# Patient Record
Sex: Male | Born: 1965 | Race: Black or African American | Hispanic: No | Marital: Single
Health system: Southern US, Community
[De-identification: ages and names within clinical notes are randomized; demographics above are authoritative.]

---

## 2016-01-18 ENCOUNTER — Other Ambulatory Visit: Payer: Self-pay | Admitting: Internal Medicine

## 2016-01-18 ENCOUNTER — Ambulatory Visit
Admission: RE | Admit: 2016-01-18 | Discharge: 2016-01-18 | Disposition: A | Discharge status: Home / Self Care | Payer: BC Managed Care – PPO | Source: Ambulatory Visit | Attending: Internal Medicine | Admitting: Internal Medicine

## 2016-01-18 DIAGNOSIS — R0781 Pleurodynia: Secondary | ICD-10-CM

## 2016-01-18 DIAGNOSIS — R059 Cough, unspecified: Secondary | ICD-10-CM

## 2016-01-18 DIAGNOSIS — R05 Cough: Secondary | ICD-10-CM

## 2016-02-15 ENCOUNTER — Other Ambulatory Visit: Payer: Self-pay | Admitting: Internal Medicine

## 2016-02-15 ENCOUNTER — Ambulatory Visit
Admission: RE | Admit: 2016-02-15 | Discharge: 2016-02-15 | Disposition: A | Discharge status: Home / Self Care | Payer: BC Managed Care – PPO | Source: Ambulatory Visit | Attending: Internal Medicine | Admitting: Internal Medicine

## 2016-02-15 DIAGNOSIS — R079 Chest pain, unspecified: Secondary | ICD-10-CM

## 2016-02-15 MED ORDER — IOPAMIDOL (ISOVUE-300) INJECTION 61%
75.0000 mL | Freq: Once | INTRAVENOUS | Status: AC | PRN
  Administered 2016-02-15: 75 mL via INTRAVENOUS

## 2018-08-11 ENCOUNTER — Ambulatory Visit
Admission: RE | Admit: 2018-08-11 | Discharge: 2018-08-11 | Disposition: A | Discharge status: Home / Self Care | Payer: BC Managed Care – PPO | Source: Ambulatory Visit | Attending: Internal Medicine | Admitting: Internal Medicine

## 2018-08-11 ENCOUNTER — Other Ambulatory Visit: Payer: Self-pay | Admitting: Internal Medicine

## 2018-08-11 DIAGNOSIS — M545 Low back pain, unspecified: Secondary | ICD-10-CM

## 2019-02-16 ENCOUNTER — Other Ambulatory Visit: Payer: Self-pay

## 2019-02-16 ENCOUNTER — Ambulatory Visit
Admission: RE | Admit: 2019-02-16 | Discharge: 2019-02-16 | Disposition: A | Discharge status: Home / Self Care | Payer: BC Managed Care – PPO | Source: Ambulatory Visit | Attending: Internal Medicine | Admitting: Internal Medicine

## 2019-02-16 ENCOUNTER — Other Ambulatory Visit: Payer: Self-pay | Admitting: Internal Medicine

## 2019-02-16 DIAGNOSIS — R059 Cough, unspecified: Secondary | ICD-10-CM

## 2019-02-16 DIAGNOSIS — R05 Cough: Secondary | ICD-10-CM

## 2019-09-14 ENCOUNTER — Other Ambulatory Visit: Payer: Self-pay

## 2019-09-14 DIAGNOSIS — Z20822 Contact with and (suspected) exposure to covid-19: Secondary | ICD-10-CM

## 2019-09-15 LAB — NOVEL CORONAVIRUS, NAA: SARS-CoV-2, NAA: NOT DETECTED

## 2019-09-23 ENCOUNTER — Other Ambulatory Visit: Payer: Self-pay

## 2019-09-23 DIAGNOSIS — Z20822 Contact with and (suspected) exposure to covid-19: Secondary | ICD-10-CM

## 2019-09-24 LAB — NOVEL CORONAVIRUS, NAA: SARS-CoV-2, NAA: NOT DETECTED

## 2020-06-01 ENCOUNTER — Other Ambulatory Visit: Payer: Self-pay | Admitting: Internal Medicine

## 2020-06-01 ENCOUNTER — Ambulatory Visit
Admission: RE | Admit: 2020-06-01 | Discharge: 2020-06-01 | Disposition: A | Discharge status: Home / Self Care | Payer: BC Managed Care – PPO | Source: Ambulatory Visit | Attending: Internal Medicine | Admitting: Internal Medicine

## 2020-06-01 ENCOUNTER — Other Ambulatory Visit: Payer: Self-pay

## 2020-06-01 DIAGNOSIS — M25551 Pain in right hip: Secondary | ICD-10-CM

## 2020-06-15 ENCOUNTER — Ambulatory Visit: Payer: BC Managed Care – PPO | Admitting: Orthopaedic Surgery

## 2020-06-29 ENCOUNTER — Ambulatory Visit: Payer: Self-pay

## 2020-06-29 ENCOUNTER — Encounter: Payer: Self-pay | Admitting: Orthopaedic Surgery

## 2020-06-29 ENCOUNTER — Ambulatory Visit (INDEPENDENT_AMBULATORY_CARE_PROVIDER_SITE_OTHER): Payer: BC Managed Care – PPO | Admitting: Orthopaedic Surgery

## 2020-06-29 DIAGNOSIS — M5441 Lumbago with sciatica, right side: Secondary | ICD-10-CM

## 2020-06-29 DIAGNOSIS — G8929 Other chronic pain: Secondary | ICD-10-CM

## 2020-06-29 DIAGNOSIS — M7061 Trochanteric bursitis, right hip: Secondary | ICD-10-CM | POA: Diagnosis not present

## 2020-06-29 DIAGNOSIS — M25511 Pain in right shoulder: Secondary | ICD-10-CM | POA: Diagnosis not present

## 2020-06-29 MED ORDER — METHYLPREDNISOLONE ACETATE 40 MG/ML IJ SUSP
40.0000 mg | INTRAMUSCULAR | Status: AC | PRN
  Administered 2020-06-29: 40 mg via INTRA_ARTICULAR

## 2020-06-29 MED ORDER — LIDOCAINE HCL 1 % IJ SOLN
3.0000 mL | INTRAMUSCULAR | Status: AC | PRN
  Administered 2020-06-29: 3 mL

## 2020-06-29 MED ORDER — DICLOFENAC SODIUM 75 MG PO TBEC: 75.0000 mg | DELAYED_RELEASE_TABLET | Freq: Two times a day (BID) | ORAL | 1 refills | Status: DC | PRN

## 2020-06-29 NOTE — Progress Notes (Signed)
Office Visit Note   Patient: Craig Bell           Date of Birth: [birth year]           MRN: [id number] Visit Date: 06/29/2020              Requested by: Georgann Housekeeper, MD [address] [place],  [place] [address] PCP: Georgann Housekeeper, MD   Assessment & Plan: Visit Diagnoses:  1. Chronic right shoulder pain   2. Chronic right-sided low back pain with right-sided sciatica   3. Trochanteric bursitis, right hip     Plan: I did show him stretching exercises for his right hip down to try 2-3 times daily.  Also have recommended steroid injection of the trochanteric area and in the right shoulder and he agreed to both of these and tolerated them well.  I explained the rationale behind injections and the risk and benefits involved.  We will put him on diclofenac as an oral anti-inflammatory.  I had like to see him back in about 6 weeks to see how he is doing overall.  I do feel that he would benefit from an ergonomic desk that allows him to stand at work and I gave him a prescription for this as well. Follow-Up Instructions: Return in about 6 weeks (around 08/10/2020).   Orders:  Orders Placed This Encounter  Procedures  . Large Joint Inj  . Large Joint Inj  . XR Shoulder Right  . XR Lumbar Spine 2-3 Views   No orders of the defined types were placed in this encounter.     Procedures: Large Joint Inj: R greater trochanter on 06/29/2020 10:44 AM Indications: pain and diagnostic evaluation Details: 22 G 1.5 in needle, lateral approach  Arthrogram: No  Medications: 3 mL lidocaine 1 %; 40 mg methylPREDNISolone acetate 40 MG/ML Outcome: tolerated well, no immediate complications Procedure, treatment alternatives, risks and benefits explained, specific risks discussed. Consent was given by the patient. Immediately prior to procedure a time out was called to verify the correct patient, procedure, equipment, support staff and site/side marked as required. Patient was  prepped and draped in the usual sterile fashion.   Large Joint Inj: R subacromial bursa on 06/29/2020 10:44 AM Indications: pain and diagnostic evaluation Details: 22 G 1.5 in needle  Arthrogram: No  Medications: 3 mL lidocaine 1 %; 40 mg methylPREDNISolone acetate 40 MG/ML Outcome: tolerated well, no immediate complications Procedure, treatment alternatives, risks and benefits explained, specific risks discussed. Consent was given by the patient. Immediately prior to procedure a time out was called to verify the correct patient, procedure, equipment, support staff and site/side marked as required. Patient was prepped and draped in the usual sterile fashion.       Clinical Data: No additional findings.   Subjective: Chief Complaint  Patient presents with  . Lower Back - Pain  . 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  He denies any groin pain.  Is been getting slowly worse for about 1 to 2 months now.  He is wondering if he would benefit from a stand-up ergonomic desk at work as well.  He denies any groin pain.  He is not a diabetic and not a smoker.  He is an active individual.  He gets worse pain laying on his right side with  the shoulder and the hip at night.  He does report some decreased motion of the right shoulder.  He is never had shoulder or back or hip surgery.  HPI  Review of Systems He currently denies any headache, chest pain, shortness of breath, fever, chills, nausea, vomiting  Objective: Vital Signs: There were no vitals taken for this visit.  Physical Exam He is alert and orient x3 and in no acute distress Ortho Exam Examination of his right shoulder does show some evidence of impingement syndrome of the right shoulder with good strength overall in good range of motion except for internal rotation with adduction which  is slightly less.  Examination of her right hip shows fluid internal and external rotation.  There is pain to palpation over the tip of the greater trochanter and IT band.  There is no significant sciatic pain.  His leg lengths are equal.  He has got normal muscle and sensory exam in both lower extremities. Specialty Comments:  No specialty comments available.  Imaging: XR Lumbar Spine 2-3 Views  Result Date: 06/29/2020 Two views of the lumbar spine show no acute findings.  The alignment is well-maintained and the joint space is well-maintained.  The top of the hip ball-and-socket be seen on both sides which appears normal.  XR Shoulder Right  Result Date: 06/29/2020 Three views of the right shoulder show no acute findings.  The glenohumeral joint is well located with no significant arthritic changes.  There is narrowing of the [facility] joint and the subacromial outlet.    PMFS History: There are no problems to display for this patient.  History reviewed. No pertinent past medical history.  History reviewed. No pertinent family history.  History reviewed. No pertinent surgical history. Social History   Occupational History  . Not on file  Tobacco Use  . Smoking status: Not on file  Substance and Sexual Activity  . Alcohol use: Not on file  . Drug use: Not on file  . Sexual activity: Not on file

## 2020-08-10 ENCOUNTER — Encounter: Payer: Self-pay | Admitting: Orthopaedic Surgery

## 2020-08-10 ENCOUNTER — Ambulatory Visit (INDEPENDENT_AMBULATORY_CARE_PROVIDER_SITE_OTHER): Payer: BC Managed Care – PPO | Admitting: Orthopaedic Surgery

## 2020-08-10 DIAGNOSIS — M7061 Trochanteric bursitis, right hip: Secondary | ICD-10-CM

## 2020-08-10 DIAGNOSIS — M25511 Pain in right shoulder: Secondary | ICD-10-CM

## 2020-08-10 DIAGNOSIS — M5441 Lumbago with sciatica, right side: Secondary | ICD-10-CM | POA: Diagnosis not present

## 2020-08-10 DIAGNOSIS — G8929 Other chronic pain: Secondary | ICD-10-CM

## 2020-08-10 NOTE — Progress Notes (Signed)
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  I first saw him 6 weeks ago for right shoulder impingement, right-sided low back pain and right hip bursitis.  I did provide a steroid injection in the right shoulder subacromial space in the right hip trochanteric area.  I showed him stretching exercises to try and we had him on diclofenac.  He states that the injections were helpful.  He has an Automotive engineer at work.  He does wear a tactical vest so that does affect his posture.  He started to have some pain again in the shoulder and the hip.  He is not a diabetic.  He has had no other acute change in medical status.  On exam his right shoulder moves well but does show some mild signs of impingement.  His right hip just has a little bit of mild pain over the trochanteric area and there is right-sided low back pain with facet joint areas and with flexion extension.  He would benefit definitely from outpatient physical therapy.  Although he works in [place] he lives on the other side at [place].  I gave him a generic prescription for outpatient physical therapy to have closer to home.  I would like them to work on his right shoulder, his right side of his low back and his right hip.  Any modalities per the therapist discretion.  I will see him back in 6 weeks to see how he is doing overall and possibly place steroid injections again if needed.  All questions and concerns were answered and addressed.

## 2020-09-12 IMAGING — DX DG HIP (WITH OR WITHOUT PELVIS) 2-3V*R*
2 series · 2 of 2 positions shown · non-contrast
Comparison: None.

CLINICAL DATA: Right hip pain.

EXAM:
DG HIP (WITH OR WITHOUT PELVIS) 2-3V RIGHT

[dg hip unilat w or w/o pelvis 2-3 views  (1 of 2)]
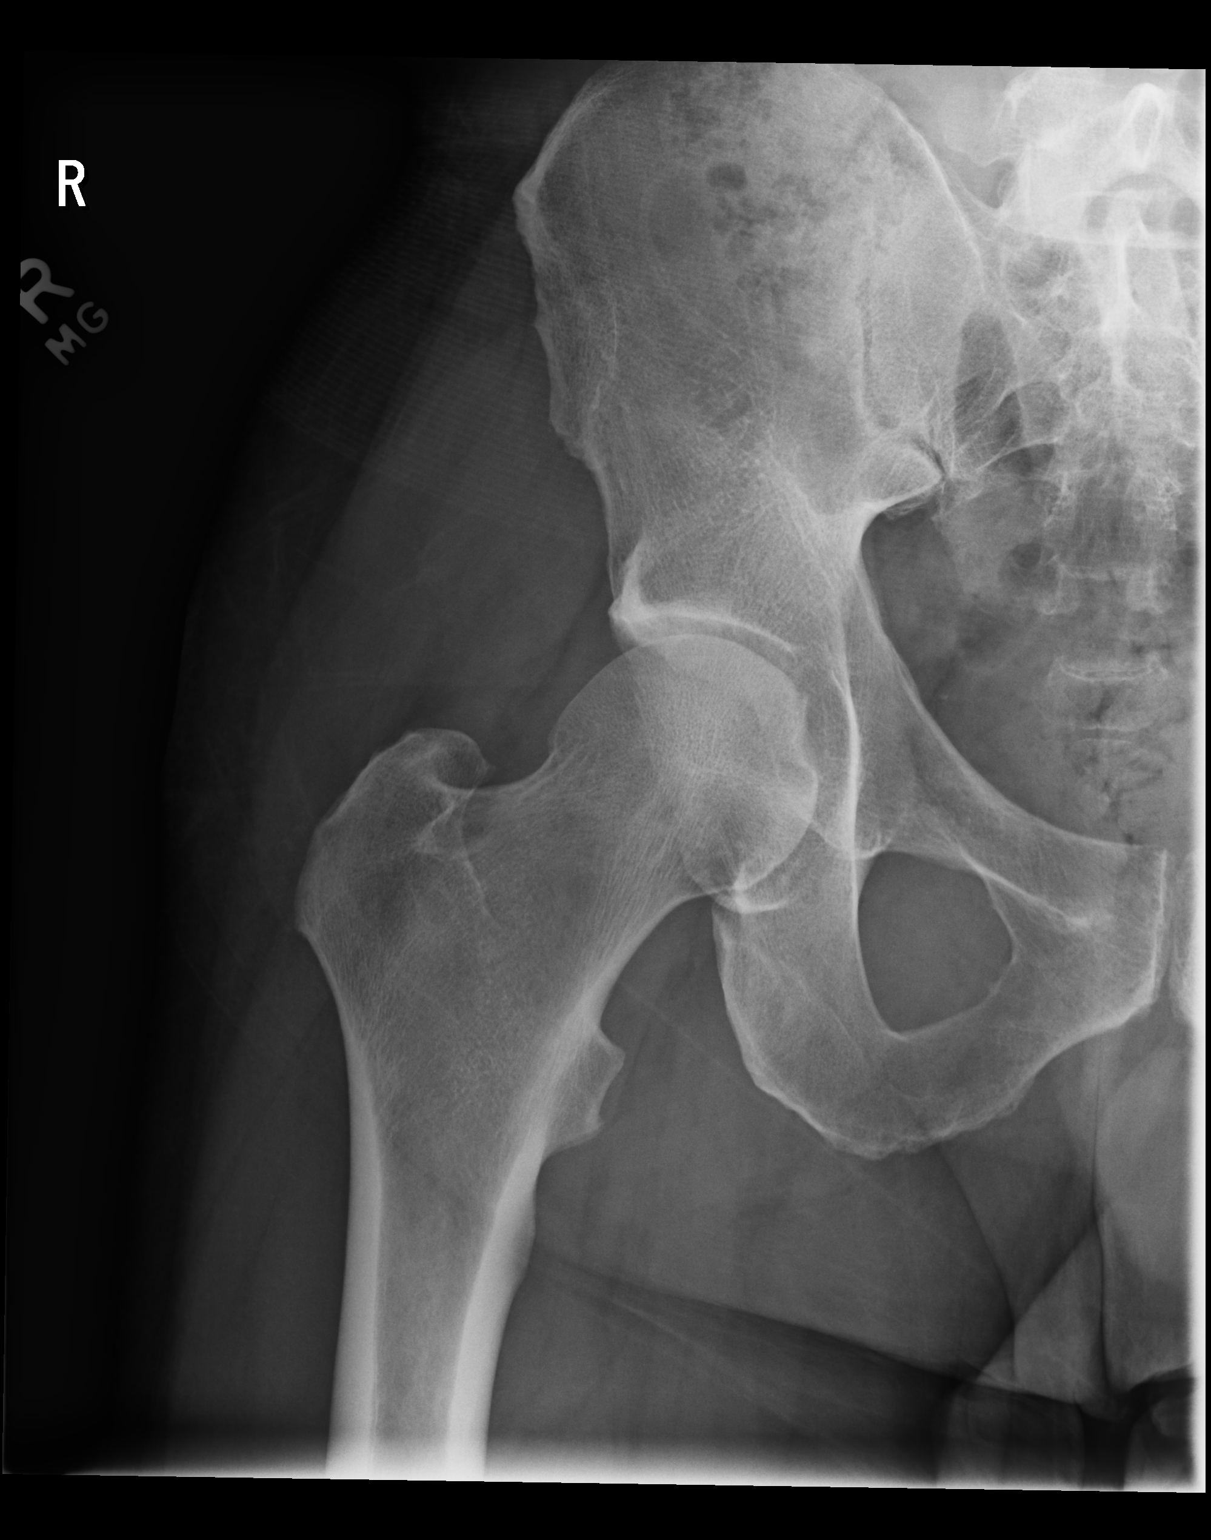

[dg hip unilat w or w/o pelvis 2-3 views  (2 of 2)]
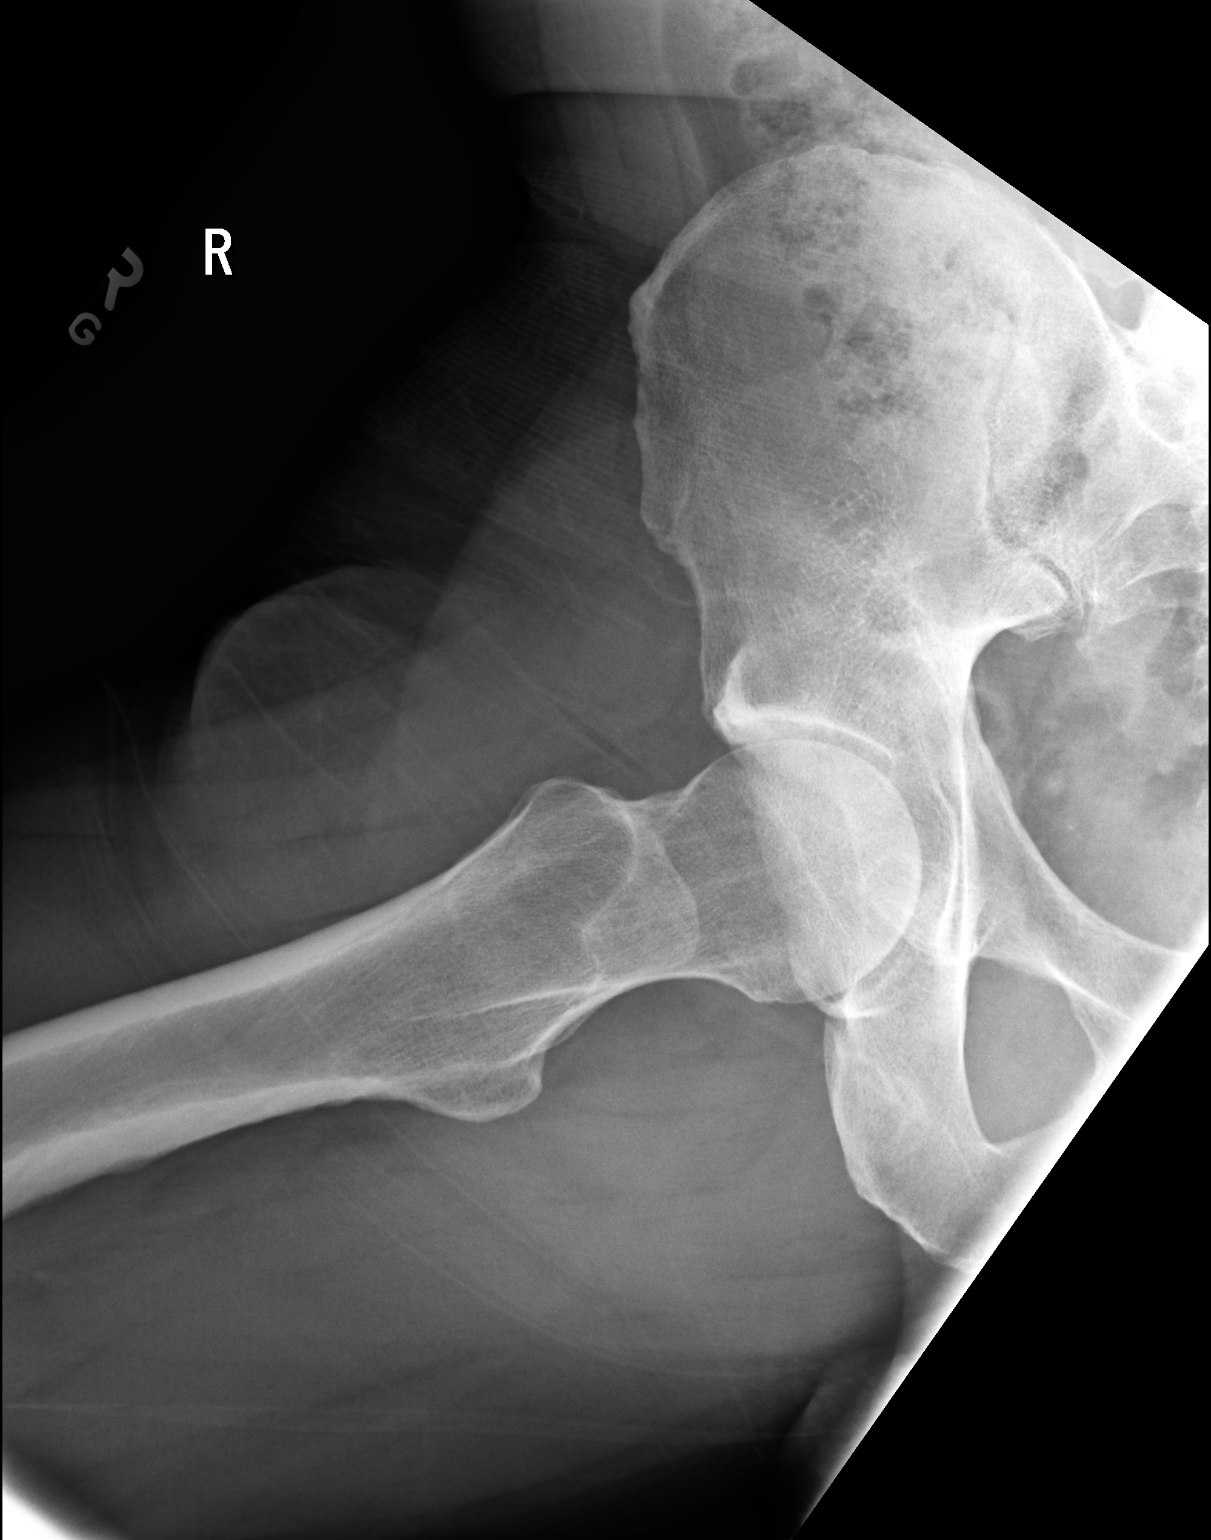

[2 of 2 positions shown; findings below may reference images not displayed]

FINDINGS: Minor hip joint space narrowing with mild acetabular spurring. No
evidence of fracture, erosion, avascular necrosis or focal bone
lesion. Pubic rami are intact. Soft tissues are unremarkable.
IMPRESSION: Mild osteoarthritis of the right hip.

## 2020-09-21 ENCOUNTER — Ambulatory Visit: Payer: BC Managed Care – PPO | Admitting: Orthopaedic Surgery

## 2022-04-27 ENCOUNTER — Other Ambulatory Visit: Payer: Self-pay | Admitting: Internal Medicine

## 2022-04-27 ENCOUNTER — Ambulatory Visit
Admission: RE | Admit: 2022-04-27 | Discharge: 2022-04-27 | Disposition: A | Discharge status: Home / Self Care | Payer: BC Managed Care – PPO | Source: Ambulatory Visit | Attending: Internal Medicine | Admitting: Internal Medicine

## 2022-04-27 DIAGNOSIS — M25512 Pain in left shoulder: Secondary | ICD-10-CM

## 2022-10-23 ENCOUNTER — Ambulatory Visit (INDEPENDENT_AMBULATORY_CARE_PROVIDER_SITE_OTHER): Payer: Managed Care, Other (non HMO)

## 2022-10-23 ENCOUNTER — Ambulatory Visit (INDEPENDENT_AMBULATORY_CARE_PROVIDER_SITE_OTHER): Payer: Managed Care, Other (non HMO) | Admitting: Orthopaedic Surgery

## 2022-10-23 DIAGNOSIS — M7542 Impingement syndrome of left shoulder: Secondary | ICD-10-CM

## 2022-10-23 DIAGNOSIS — M7061 Trochanteric bursitis, right hip: Secondary | ICD-10-CM | POA: Diagnosis not present

## 2022-10-23 DIAGNOSIS — M25551 Pain in right hip: Secondary | ICD-10-CM

## 2022-10-23 MED ORDER — HYDROCODONE-ACETAMINOPHEN 5-325 MG PO TABS: 1.0000 | ORAL_TABLET | Freq: Every evening | ORAL | 0 refills | Status: AC | PRN

## 2022-10-23 MED ORDER — CELECOXIB 200 MG PO CAPS: 200.0000 mg | ORAL_CAPSULE | Freq: Two times a day (BID) | ORAL | 1 refills | Status: DC | PRN

## 2022-10-23 MED ORDER — LIDOCAINE HCL 1 % IJ SOLN
3.0000 mL | INTRAMUSCULAR | Status: AC | PRN
  Administered 2022-10-23: 3 mL

## 2022-10-23 MED ORDER — METHYLPREDNISOLONE ACETATE 40 MG/ML IJ SUSP
40.0000 mg | INTRAMUSCULAR | Status: AC | PRN
  Administered 2022-10-23: 40 mg via INTRA_ARTICULAR

## 2022-10-23 MED ORDER — CELECOXIB 200 MG PO CAPS: 200.0000 mg | ORAL_CAPSULE | Freq: Two times a day (BID) | ORAL | 1 refills | Status: AC | PRN

## 2022-10-23 MED ORDER — HYDROCODONE-ACETAMINOPHEN 5-325 MG PO TABS: 1.0000 | ORAL_TABLET | Freq: Every evening | ORAL | 0 refills | Status: DC | PRN

## 2022-10-23 NOTE — Progress Notes (Signed)
The patient is a 57 year old gentleman last seen in the past.  This was for his right shoulder with impingement syndrome and right hip trochanteric bursitis.  This has been since 2021 that I saw him.  He comes in today with right hip pain again over the trochanteric area of his hip.  He also reports chronic left shoulder pain with decreased range of motion.  He has been in lawn for cement for many years now and now works in Therapist, music for [place]  He had x-rays of the left shoulder in July and they are on the canopy system for me to review.  He reports decree strength and range of motion of that shoulder.  The pain is all around the shoulder itself and does not radiate into his neck or down his arm.  He still points to his right hip trochanteric area as a source of his pain.  He said injection in that right hip and right shoulder helped in the past and his right shoulder is still pain-free.  He is not a diabetic.  He is scheduled to have a shingles vaccination later today and I suggested he have this in his right shoulder not the left side.  Examination of his right hip shows it moves smoothly and fluidly no pain in the groin at all.  The pain is all around the tip of the greater trochanter and the proximal IT band.  The remainder of the hip exam is normal.  His left shoulder shows significant pain with crossarm test with positive Neer and Hawkins signs.  He has pain with external rotation and slight weakness.  His liftoff is negative.  He uses his rotator cuff appropriately to abduct his shoulder.  3 views of the left shoulder in the canopy system reviewed and show some arthritic changes of the [facility] joint and the glenohumeral joint.  The humeral head is not high riding.  X-rays today in our office of his pelvis and right hip show normal-appearing hips bilaterally and no cortical irregularities around the trochanteric area of the hip joint or pelvis.  From a hip standpoint, I recommended a trochanteric  injection since this has helped him in the past and lasted for many years now.  He agreed to this and tolerated it well.  Also recommended a subacromial injection in his left shoulder.  He tolerated this as well.  I would like to see him back in 2 weeks to see how he is doing overall.  I will send in some Celebrex and occasional hydrocodone to help him sleep at night.  If he continues to have significant shoulder discomfort 2 weeks from now we will obtain an MRI of the shoulder.     Procedure Note  Patient: Craig Bell             Date of Birth: Jan 17, 1966           MRN: [id number]             Visit Date: 10/23/2022  Procedures: Visit Diagnoses:  1. Pain of right hip   2. Trochanteric bursitis, right hip   3. Impingement syndrome of left shoulder     Large Joint Inj: L subacromial bursa on 10/23/2022 9:15 AM Indications: pain and diagnostic evaluation Details: 22 G 1.5 in needle  Arthrogram: No  Medications: 3 mL lidocaine 1 %; 40 mg methylPREDNISolone acetate 40 MG/ML Outcome: tolerated well, no immediate complications Procedure, treatment alternatives, risks and benefits explained, specific risks  discussed. Consent was given by the patient. Immediately prior to procedure a time out was called to verify the correct patient, procedure, equipment, support staff and site/side marked as required. Patient was prepped and draped in the usual sterile fashion.    Large Joint Inj: R greater trochanter on 10/23/2022 9:15 AM Indications: pain and diagnostic evaluation Details: 22 G 1.5 in needle, lateral approach  Arthrogram: No  Medications: 3 mL lidocaine 1 %; 40 mg methylPREDNISolone acetate 40 MG/ML Outcome: tolerated well, no immediate complications Procedure, treatment alternatives, risks and benefits explained, specific risks discussed. Consent was given by the patient. Immediately prior to procedure a time out was called to verify the correct patient, procedure, equipment, support  staff and site/side marked as required. Patient was prepped and draped in the usual sterile fashion.

## 2022-11-14 ENCOUNTER — Encounter: Payer: Self-pay | Admitting: Orthopaedic Surgery

## 2022-11-14 ENCOUNTER — Other Ambulatory Visit: Payer: Self-pay

## 2022-11-14 ENCOUNTER — Telehealth: Payer: Self-pay | Admitting: Orthopaedic Surgery

## 2022-11-14 ENCOUNTER — Ambulatory Visit (INDEPENDENT_AMBULATORY_CARE_PROVIDER_SITE_OTHER): Payer: Managed Care, Other (non HMO) | Admitting: Orthopaedic Surgery

## 2022-11-14 ENCOUNTER — Ambulatory Visit (INDEPENDENT_AMBULATORY_CARE_PROVIDER_SITE_OTHER): Payer: Managed Care, Other (non HMO)

## 2022-11-14 DIAGNOSIS — G8929 Other chronic pain: Secondary | ICD-10-CM

## 2022-11-14 DIAGNOSIS — M79644 Pain in right finger(s): Secondary | ICD-10-CM

## 2022-11-14 DIAGNOSIS — M79645 Pain in left finger(s): Secondary | ICD-10-CM

## 2022-11-14 DIAGNOSIS — M7542 Impingement syndrome of left shoulder: Secondary | ICD-10-CM

## 2022-11-14 DIAGNOSIS — M7061 Trochanteric bursitis, right hip: Secondary | ICD-10-CM

## 2022-11-14 NOTE — Progress Notes (Signed)
The patient comes in today for follow-up still having significant left shoulder pain and impingement syndrome symptoms.  He has been through therapy on that shoulder and a home exercise program as well as multiple injections.  X-rays do show decrease in subacromial outlet of left shoulder.  On exam today his left shoulder still has painful arc of motion of the shoulder with slight weakness in the cuff with positive Neer and Hawkins signs as well.  He does complain of right thumb pain.  This is at the base of the thumb and the thenar area.  He injured this thumb in 2005.  After that he did see hand specialist and went through therapy.  He still has significant pain on a daily basis in this area of his right hand.  On examination of his right thumb there is pain all along the thenar area of the thumb but no triggering.  There is negative grind test and Finkelstein test.  X-rays of the hand and right thumb show no acute findings.  At this point a MRI of his left shoulder is warranted to assess the rotator cuff and an MRI of his right hand is warranted to assess for pathology around the base of the right thumb and thenar area for a source of chronic pain in these areas.  We will see him back once we have these MRIs.

## 2022-11-14 NOTE — Telephone Encounter (Signed)
Patient want his hip Mri with his shoulder Mri. Please advise.

## 2022-12-03 ENCOUNTER — Ambulatory Visit: Payer: BC Managed Care – PPO | Admitting: Orthopaedic Surgery

## 2022-12-08 ENCOUNTER — Ambulatory Visit
Admission: RE | Admit: 2022-12-08 | Discharge: 2022-12-08 | Disposition: A | Discharge status: Home / Self Care | Payer: BC Managed Care – PPO | Source: Ambulatory Visit | Attending: Orthopaedic Surgery | Admitting: Orthopaedic Surgery

## 2022-12-08 DIAGNOSIS — M7061 Trochanteric bursitis, right hip: Secondary | ICD-10-CM

## 2022-12-08 DIAGNOSIS — M7542 Impingement syndrome of left shoulder: Secondary | ICD-10-CM

## 2022-12-08 DIAGNOSIS — G8929 Other chronic pain: Secondary | ICD-10-CM

## 2022-12-12 ENCOUNTER — Ambulatory Visit (INDEPENDENT_AMBULATORY_CARE_PROVIDER_SITE_OTHER): Payer: Managed Care, Other (non HMO) | Admitting: Orthopaedic Surgery

## 2022-12-12 ENCOUNTER — Other Ambulatory Visit: Payer: Self-pay

## 2022-12-12 DIAGNOSIS — M7542 Impingement syndrome of left shoulder: Secondary | ICD-10-CM

## 2022-12-12 DIAGNOSIS — M79644 Pain in right finger(s): Secondary | ICD-10-CM

## 2022-12-12 DIAGNOSIS — M7061 Trochanteric bursitis, right hip: Secondary | ICD-10-CM | POA: Diagnosis not present

## 2022-12-12 DIAGNOSIS — G8929 Other chronic pain: Secondary | ICD-10-CM

## 2022-12-12 NOTE — Progress Notes (Signed)
The patient is a 57 year old gentleman who used to work in Teacher, adult education for [employer] who comes in for follow-up today after having an MRI of his left shoulder, his right hip and his right thumb.  He remotely injured that thumb years ago and was out of work for a period of time for the thumb.  He did see a hand specialist at one point.  His left shoulder is really was bothering the most with overhead activities and he would like to move his shoulder better.  Back when he was very active he reported doing about 600 push-ups every other day.  His left shoulder does show still signs of impingement.  The MRI shows a small partial-thickness rotator cuff tear but no muscle atrophy.  The remainder of the shoulder looks normal.  Both hips have pain over the trochanteric area.  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  There is some tendinosis of the gluteus medius and minimus tendons.  Both hips move smoothly and fluidly but do have pain over the lateral aspect.  The MRI of his right thumb does show arthritic changes at the MCP joint and some of the [facility] joint.  At this point I would like to send him to physical therapy for his left shoulder and his hips.  We also will refer him to the hand [place] to one of her hand fellowship trained surgeons to further evaluate and treat his right thumb based on her chronic injury and what his symptoms are in terms of the arthritic changes and pain that he is experiencing in his right thumb.  He agrees with the treatment plan.  Will see him back in 6 weeks after course of therapy for his left shoulder and both hips.

## 2022-12-26 ENCOUNTER — Ambulatory Visit: Payer: BC Managed Care – PPO | Attending: Orthopaedic Surgery

## 2022-12-26 ENCOUNTER — Other Ambulatory Visit: Payer: Self-pay

## 2022-12-26 DIAGNOSIS — G8929 Other chronic pain: Secondary | ICD-10-CM | POA: Diagnosis present

## 2022-12-26 DIAGNOSIS — R29898 Other symptoms and signs involving the musculoskeletal system: Secondary | ICD-10-CM | POA: Insufficient documentation

## 2022-12-26 DIAGNOSIS — M25612 Stiffness of left shoulder, not elsewhere classified: Secondary | ICD-10-CM | POA: Insufficient documentation

## 2022-12-26 DIAGNOSIS — M25551 Pain in right hip: Secondary | ICD-10-CM | POA: Insufficient documentation

## 2022-12-26 DIAGNOSIS — M25512 Pain in left shoulder: Secondary | ICD-10-CM | POA: Diagnosis present

## 2022-12-26 DIAGNOSIS — S46012S Strain of muscle(s) and tendon(s) of the rotator cuff of left shoulder, sequela: Secondary | ICD-10-CM | POA: Diagnosis present

## 2022-12-26 DIAGNOSIS — S46012D Strain of muscle(s) and tendon(s) of the rotator cuff of left shoulder, subsequent encounter: Secondary | ICD-10-CM | POA: Insufficient documentation

## 2022-12-26 DIAGNOSIS — M7061 Trochanteric bursitis, right hip: Secondary | ICD-10-CM | POA: Diagnosis not present

## 2022-12-26 DIAGNOSIS — M7542 Impingement syndrome of left shoulder: Secondary | ICD-10-CM | POA: Diagnosis not present

## 2022-12-26 NOTE — Therapy (Signed)
OUTPATIENT PHYSICAL THERAPY SHOULDER EVALUATION   Patient Name: Craig Bell MRN: [id number] DOB:11-Jan-1966, 57 y.o., male Today's Date: 12/26/2022  END OF SESSION:  PT End of Session - 12/26/22 0906     Visit Number 1    Date for PT Re-Evaluation 03/20/23    Progress Note Due on Visit 10    PT Start Time 0812    PT Stop Time 0850    PT Time Calculation (min) 38 min    Activity Tolerance Patient limited by pain    Behavior During Therapy [facility] for tasks assessed/performed             History reviewed. No pertinent past medical history. History reviewed. No pertinent surgical history. There are no problems to display for this patient.   PCP: Wenda Low, MD  REFERRING PROVIDER: Jean Rosenthal, MD  REFERRING DIAG: chronic pain L shoulder and R hip  THERAPY DIAG:  Strain of musc/tend the rotator cuff of l shoulder, sequela  Chronic left shoulder pain  Weakness of shoulder  Stiffness of left shoulder, not elsewhere classified  Pain in right hip  Rationale for Evaluation and Treatment: Rehabilitation  ONSET DATE: 11/2021  SUBJECTIVE:   SUBJECTIVE STATEMENT: Shoulder hurts every day, can't sleep on L side, has stopped working out for about a year.  Injection L shoulder did not make a huge difference  R hip pain over a year.  laterally on same side when carrying gun, hurts with walking, hurts with standing, hurts with sitting down too long.  Injection R hip helped.    PERTINENT HISTORY: Chronic L shoulder and R lateral hip pain.  Saw orthopedist, had MRI, received injections L shoulder and R hip, referred to PT for eval.  PAIN: 8/10 L shoulder at rest, 0/10 R hip today Are you having pain? Yes: Pain location: L shoulder Pain description: constant pain , ache Aggravating factors: lifting a certain way Relieving factors: meds somewhat  PRECAUTIONS: None  WEIGHT BEARING RESTRICTIONS: No  FALLS:  Has patient fallen in last 6 months? No  LIVING  ENVIRONMENT: unknown  OCCUPATION: Engineer, structural  PLOF: Independent  PATIENT GOALS: get rid of pain, resume working out as able  NEXT MD VISIT: mid April  OBJECTIVE:   DIAGNOSTIC FINDINGS:Copied from orthopedist's note 12/12/22: His left shoulder does show still signs of impingement.  The MRI shows a small partial-thickness rotator cuff tear but no muscle atrophy.  The remainder of the shoulder looks normal.   Both hips have pain over the trochanteric area.  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  There is some tendinosis of the gluteus medius and minimus tendons.  Both hips move smoothly and fluidly but do have pain over the lateral aspect.  PATIENT SURVEYS:  Quick DASH 50%  COGNITION: Overall cognitive status: Within functional limits for tasks assessed     SENSATION: WFL  EDEMA:  None noted   POSTURE: rounded shoulders and forward head  PALPATION: L long head biceps, L infraspinatus point tender. L supraspinatus non tender  UPPER EXTREMITY AAROM: supine L shoulder flex 120, abd 110, ER 50, IR 50, all painful throughout motion Seated AROM L shoulder, flex 90, abd 90, able to place L hand behind base of neck with forward bending of head and neck, IR reaches to L gr trochanter   MMT: L bicpes 4/5, IR painful 3+/5, Er 4+/5 not painful   GAIT: no  deficits noted.    TODAY'S TREATMENT:                                                                                                                              DATE: 12/26/22  Iontophoresis L shoulder over long head biceps tendon, advised to leave in place for 4 hrs Kinesiotaping L shoulder 2 I pieces, 35% pull distal to proximal, lateral deltoid along supraspinatus and infraspinatus tendons to assist with resting posture/tension post shoulder musculature Therx:instructed in pendulum and low intensity isometric L shoulder ER  PATIENT  EDUCATION:  Education details: POC, goals Person educated: Patient Education method: Explanation, Demonstration, and Handouts Education comprehension: verbalized understanding, tactile cues required, and needs further education  HOME EXERCISE PROGRAM: Access Code: [email or web address] URL: [email or web address] Date: 12/26/2022 Prepared by: Alayasia Breeding  Exercises - Circular Shoulder Pendulum with Table Support  - 1 x daily - 7 x weekly - 3 sets - 10 reps - Standing Isometric Shoulder External Rotation with Doorway  - 1 x daily - 7 x weekly - 3 sets - 10 reps  ASSESSMENT:  CLINICAL IMPRESSION: Patient is a 57 y.o. male who was seen today for physical therapy evaluation and treatment for L shoulder pain and L hip pain.  Abbreviated session due to started 12 min late into appt.  As his L shoulder was the most pertinent issue performed evaluation and initiated Rx with shoulder today, will need evaluation of R hip at another session.  He presents with high irritability L shoulder, rating resting pain 8/10, did not respond well to injection 2 weeks ago, point tender entire L shoulder joint.  Therefore initiated Rx with approaches to reduce the tissue irritability and maintain his ROM.  He will see the orthopedist mid April, so advised patient that we he should notice some improvement hopefully within the first few weeks, if not will reassess and refer back to MD or change Rx approach.   PARTICIPATION LIMITATIONS: community activity, occupation, and yard work  PERSONAL FACTORS: Time since onset of injury/illness/exacerbation are also affecting patient's functional outcome.   REHAB POTENTIAL: Good  CLINICAL DECISION MAKING: Stable/uncomplicated  EVALUATION COMPLEXITY: Low   GOALS: Goals reviewed with patient? Yes  SHORT TERM GOALS: Target date: 01/09/23 I HEP Baseline:initiated today Goal status: INITIAL     LONG TERM GOALS: Target date: 03/20/23  Improve AROm L shoulder to  greater than 140 flex and abd Baseline: 90 Goal status: INITIAL  2.  Improve quick DASH score to 25 L shoulder Baseline: 50 Goal status: INITIAL  3.  Improve strength L shoulder all motions to 5/5  Baseline: 3+/5 IR, 3- flex, abd, 4/5 biceps, 4-/5 ER Goal status: INITIAL     PLAN:  PT FREQUENCY: 1-2x/week  PT DURATION: 12 weeks  PLANNED INTERVENTIONS: Therapeutic exercises, Therapeutic activity, Neuromuscular re-education, Balance training, Gait training, Patient/Family education, Self Care, and Joint mobilization  PLAN FOR NEXT SESSION: ? How was ionto? How was tape?  Possible dry needling, gentle L shoulder ROM, advance isometrics, possible estim.    Kenae Lindquist L Idris Edmundson, PT 12/26/2022, 9:32 AM

## 2023-01-01 ENCOUNTER — Ambulatory Visit: Payer: BC Managed Care – PPO | Admitting: Physical Therapy

## 2023-01-04 ENCOUNTER — Ambulatory Visit: Payer: BC Managed Care – PPO | Admitting: Physical Therapy

## 2023-01-09 ENCOUNTER — Encounter: Payer: Self-pay | Admitting: Physical Therapy

## 2023-01-09 ENCOUNTER — Ambulatory Visit: Payer: BC Managed Care – PPO | Admitting: Physical Therapy

## 2023-01-09 DIAGNOSIS — S46012D Strain of muscle(s) and tendon(s) of the rotator cuff of left shoulder, subsequent encounter: Secondary | ICD-10-CM | POA: Diagnosis not present

## 2023-01-09 DIAGNOSIS — M25612 Stiffness of left shoulder, not elsewhere classified: Secondary | ICD-10-CM

## 2023-01-09 DIAGNOSIS — R29898 Other symptoms and signs involving the musculoskeletal system: Secondary | ICD-10-CM

## 2023-01-09 DIAGNOSIS — S46012S Strain of muscle(s) and tendon(s) of the rotator cuff of left shoulder, sequela: Secondary | ICD-10-CM | POA: Diagnosis not present

## 2023-01-09 DIAGNOSIS — G8929 Other chronic pain: Secondary | ICD-10-CM

## 2023-01-09 DIAGNOSIS — M25551 Pain in right hip: Secondary | ICD-10-CM

## 2023-01-09 NOTE — Therapy (Signed)
OUTPATIENT PHYSICAL THERAPY SHOULDER TREATMENT   Patient Name: Craig Bell MRN: [id number] DOB:[birth year], 57 y.o., male Today's Date: 01/09/2023  END OF SESSION:  PT End of Session - 01/09/23 0759     Visit Number 2    Date for PT Re-Evaluation 03/20/23    PT Start Time 0800    PT Stop Time 0845    PT Time Calculation (min) 45 min    Activity Tolerance Patient limited by pain;Patient tolerated treatment well    Behavior During Therapy [facility] for tasks assessed/performed             History reviewed. No pertinent past medical history. History reviewed. No pertinent surgical history. There are no problems to display for this patient.   PCP: Wenda Low, MD  REFERRING PROVIDER: Jean Rosenthal, MD  REFERRING DIAG: chronic pain L shoulder and R hip  THERAPY DIAG:  Strain of musc/tend the rotator cuff of l shoulder, sequela  Chronic left shoulder pain  Weakness of shoulder  Stiffness of left shoulder, not elsewhere classified  Pain in right hip  Rationale for Evaluation and Treatment: Rehabilitation  ONSET DATE: 11/2021  SUBJECTIVE:   SUBJECTIVE STATEMENT: Doing ok  PERTINENT HISTORY: Chronic L shoulder and R lateral hip pain.  Saw orthopedist, had MRI, received injections L shoulder and R hip, referred to PT for eval.  PAIN: 8/10 L shoulder at rest, 0/10 R hip today Are you having pain? Yes: Pain location: L shoulder Pain description: constant pain , ache Aggravating factors: lifting a certain way Relieving factors: meds somewhat  PRECAUTIONS: None  WEIGHT BEARING RESTRICTIONS: No  FALLS:  Has patient fallen in last 6 months? No  LIVING ENVIRONMENT: unknown  OCCUPATION: Engineer, structural  PLOF: Independent  PATIENT GOALS: get rid of pain, resume working out as able  NEXT MD VISIT: mid April  OBJECTIVE:   DIAGNOSTIC FINDINGS:Copied from orthopedist's note 12/12/22: His left shoulder does show still signs of impingement.  The MRI shows  a small partial-thickness rotator cuff tear but no muscle atrophy.  The remainder of the shoulder looks normal.   Both hips have pain over the trochanteric area.  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  There is some tendinosis of the gluteus medius and minimus tendons.  Both hips move smoothly and fluidly but do have pain over the lateral aspect.  PATIENT SURVEYS:  Quick DASH 50%  COGNITION: Overall cognitive status: Within functional limits for tasks assessed     SENSATION: WFL  EDEMA:  None noted   POSTURE: rounded shoulders and forward head  PALPATION: L long head biceps, L infraspinatus point tender. L supraspinatus non tender  UPPER EXTREMITY AAROM: supine L shoulder flex 120, abd 110, ER 50, IR 50, all painful throughout motion Seated AROM L shoulder, flex 90, abd 90, able to place L hand behind base of neck with forward bending of head and neck, IR reaches to L gr trochanter   MMT: L bicpes 4/5, IR painful 3+/5, Er 4+/5 not painful   GAIT: no deficits noted.    TODAY'S TREATMENT:  DATE:  01/09/23 UBE L2 x 3 min each Rows & Ext Blue 2x10 LUE IR red 2x10 Triceps Ext 35lb 2x10 L shoulder ROM   Flex 132  Abd 118 ER 82  IR 26 LUE PROM inall directions with end range holds Iontophoresis L shoulder over long head biceps tendon, advised to leave in place for 4 hrs   12/26/22  Iontophoresis L shoulder over long head biceps tendon, advised to leave in place for 4 hrs Kinesiotaping L shoulder 2 I pieces, 35% pull distal to proximal, lateral deltoid along supraspinatus and infraspinatus tendons to assist with resting posture/tension post shoulder musculature Therx:instructed in pendulum and low intensity isometric L shoulder ER  PATIENT EDUCATION:  Education  details: POC, goals Person educated: Patient Education method: Explanation, Demonstration, and Handouts Education comprehension: verbalized understanding, tactile cues required, and needs further education  HOME EXERCISE PROGRAM: Access Code: [email or web address] URL: [email or web address] Date: 12/26/2022 Prepared by: Amy Speaks  Exercises - Circular Shoulder Pendulum with Table Support  - 1 x daily - 7 x weekly - 3 sets - 10 reps - Standing Isometric Shoulder External Rotation with Doorway  - 1 x daily - 7 x weekly - 3 sets - 10 reps  ASSESSMENT:  CLINICAL IMPRESSION: Patient is a 57 y.o. male who was seen today for physical therapy treatment for L shoulder pain and L hip pain. Pt chief complaint is L shoulder pain. Session consisted of light shoulder and scapular strengthening. L shoulder ROM is limited both actively and passively .Some pain at end range of PROM. Positive response to ionto from last session.   PARTICIPATION LIMITATIONS: community activity, occupation, and yard work  PERSONAL FACTORS: Time since onset of injury/illness/exacerbation are also affecting patient's functional outcome.   REHAB POTENTIAL: Good  CLINICAL DECISION MAKING: Stable/uncomplicated  EVALUATION COMPLEXITY: Low   GOALS: Goals reviewed with patient? Yes  SHORT TERM GOALS: Target date: 01/09/23 I HEP Baseline:initiated today Goal status: Met 01/09/23     LONG TERM GOALS: Target date: 03/20/23  Improve AROm L shoulder to greater than 140 flex and abd Baseline: 90 Goal status: INITIAL  2.  Improve quick DASH score to 25 L shoulder Baseline: 50 Goal status: INITIAL  3.  Improve strength L shoulder all motions to 5/5  Baseline: 3+/5 IR, 3- flex, abd, 4/5 biceps, 4-/5 ER Goal status: INITIAL     PLAN:  PT FREQUENCY: 1-2x/week  PT DURATION: 12 weeks  PLANNED INTERVENTIONS: Therapeutic exercises, Therapeutic activity, Neuromuscular re-education, Balance training, Gait training,  Patient/Family education, Self Care, and Joint mobilization  PLAN FOR NEXT SESSION: ? How was ionto? How was tape?  Possible dry needling, gentle L shoulder ROM, advance isometrics, possible estim.    Scot Jun, PTA 01/09/2023, 8:00 AM

## 2023-01-10 ENCOUNTER — Encounter: Payer: Self-pay | Admitting: Physical Therapy

## 2023-01-10 ENCOUNTER — Ambulatory Visit: Payer: BC Managed Care – PPO | Admitting: Physical Therapy

## 2023-01-10 DIAGNOSIS — G8929 Other chronic pain: Secondary | ICD-10-CM

## 2023-01-10 DIAGNOSIS — R29898 Other symptoms and signs involving the musculoskeletal system: Secondary | ICD-10-CM

## 2023-01-10 DIAGNOSIS — S46012D Strain of muscle(s) and tendon(s) of the rotator cuff of left shoulder, subsequent encounter: Secondary | ICD-10-CM | POA: Diagnosis not present

## 2023-01-10 DIAGNOSIS — S46012S Strain of muscle(s) and tendon(s) of the rotator cuff of left shoulder, sequela: Secondary | ICD-10-CM

## 2023-01-10 DIAGNOSIS — M25551 Pain in right hip: Secondary | ICD-10-CM

## 2023-01-10 DIAGNOSIS — M25612 Stiffness of left shoulder, not elsewhere classified: Secondary | ICD-10-CM

## 2023-01-10 NOTE — Therapy (Signed)
OUTPATIENT PHYSICAL THERAPY SHOULDER TREATMENT   Patient Name: Craig Bell MRN: [id number] DOB:[birth year], 57 y.o., male Today's Date: 01/10/2023  END OF SESSION:  PT End of Session - 01/10/23 1057     Visit Number 3    Date for PT Re-Evaluation 03/20/23    PT Start Time 1057    PT Stop Time [id number]    PT Time Calculation (min) 46 min    Activity Tolerance Patient limited by pain;Patient tolerated treatment well    Behavior During Therapy [facility] for tasks assessed/performed             History reviewed. No pertinent past medical history. History reviewed. No pertinent surgical history. There are no problems to display for this patient.   PCP: Wenda Low, MD  REFERRING PROVIDER: Jean Rosenthal, MD  REFERRING DIAG: chronic pain L shoulder and R hip  THERAPY DIAG:  Strain of musc/tend the rotator cuff of l shoulder, sequela  Chronic left shoulder pain  Weakness of shoulder  Stiffness of left shoulder, not elsewhere classified  Pain in right hip  Rationale for Evaluation and Treatment: Rehabilitation  ONSET DATE: 11/2021  SUBJECTIVE:   SUBJECTIVE STATEMENT: Doing okay, a little sore from PT yesterday  PERTINENT HISTORY: Chronic L shoulder and R lateral hip pain.  Saw orthopedist, had MRI, received injections L shoulder and R hip, referred to PT for eval.  PAIN: 8/10 L shoulder at rest, 0/10 R hip today Are you having pain? Yes: Pain location: L shoulder Pain description: constant pain , ache Aggravating factors: lifting a certain way Relieving factors: meds somewhat  PRECAUTIONS: None  WEIGHT BEARING RESTRICTIONS: No  FALLS:  Has patient fallen in last 6 months? No  LIVING ENVIRONMENT: unknown  OCCUPATION: Engineer, structural  PLOF: Independent  PATIENT GOALS: get rid of pain, resume working out as able  NEXT MD VISIT: mid April  OBJECTIVE:   DIAGNOSTIC FINDINGS:Copied from orthopedist's note 12/12/22: His left shoulder does show still  signs of impingement.  The MRI shows a small partial-thickness rotator cuff tear but no muscle atrophy.  The remainder of the shoulder looks normal.   Both hips have pain over the trochanteric area.  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  There is some tendinosis of the gluteus medius and minimus tendons.  Both hips move smoothly and fluidly but do have pain over the lateral aspect.  PATIENT SURVEYS:  Quick DASH 50%  COGNITION: Overall cognitive status: Within functional limits for tasks assessed     SENSATION: WFL  EDEMA:  None noted   POSTURE: rounded shoulders and forward head  PALPATION: L long head biceps, L infraspinatus point tender. L supraspinatus non tender  UPPER EXTREMITY AAROM: supine L shoulder flex 120, abd 110, ER 50, IR 50, all painful throughout motion Seated AROM L shoulder, flex 90, abd 90, able to place L hand behind base of neck with forward bending of head and neck, IR reaches to L gr trochanter   MMT: L bicpes 4/5, IR painful 3+/5, Er 4+/5 not painful   GAIT: no deficits noted.    TODAY'S TREATMENT:  DATE:  01/10/23 Nustep level 5 x 6 minutes 25# rows 2x10 cues for form and posture 35# lats 2x10 10# straight arm pulls 5# IR on pulleys Red tband ER Red tband horizontal abduction 35# triceps 2x10 Passive stretch all shoulder motions Ionto patch 15mA dose left biceps origin.  Again advised 4 hours only as he thought he could leave on for 4 days  01/09/23 UBE L2 x 3 min each Rows & Ext Blue 2x10 LUE IR red 2x10 Triceps Ext 35lb 2x10 L shoulder ROM   Flex 132  Abd 118 ER 82  IR 26 LUE PROM inall directions with end range holds Iontophoresis L shoulder over long head biceps tendon, advised to leave in place for 4 hrs   12/26/22   Iontophoresis L shoulder over long head biceps tendon, advised to leave in place for 4 hrs Kinesiotaping L shoulder 2 I pieces, 35% pull distal to proximal, lateral deltoid along supraspinatus and infraspinatus tendons to assist with resting posture/tension post shoulder musculature Therx:instructed in pendulum and low intensity isometric L shoulder ER  PATIENT EDUCATION:  Education details: POC, goals Person educated: Patient Education method: Explanation, Demonstration, and Handouts Education comprehension: verbalized understanding, tactile cues required, and needs further education  HOME EXERCISE PROGRAM: Access Code: [email or web address] URL: [email or web address] Date: 12/26/2022 Prepared by: Amy Speaks  Exercises - Circular Shoulder Pendulum with Table Support  - 1 x daily - 7 x weekly - 3 sets - 10 reps - Standing Isometric Shoulder External Rotation with Doorway  - 1 x daily - 7 x weekly - 3 sets - 10 reps  ASSESSMENT:  CLINICAL IMPRESSION: Patient is a 57 y.o. male who was seen today for physical therapy treatment for L shoulder pain and L hip pain. Pt chief complaint is L shoulder pain. Continued to add shoulder strength and focus was on scapular stability and posture, he tends to be rounded shoulders, needing cues for posture and some cues for form on the machines.  Had pain with the lat pulls, all PROM was good except for abduction and flexion were painful toward end ranges  PARTICIPATION LIMITATIONS: community activity, occupation, and yard work  PERSONAL FACTORS: Time since onset of injury/illness/exacerbation are also affecting patient's functional outcome.   REHAB POTENTIAL: Good  CLINICAL DECISION MAKING: Stable/uncomplicated  EVALUATION COMPLEXITY: Low   GOALS: Goals reviewed with patient? Yes  SHORT TERM GOALS: Target date: 01/09/23 I HEP Baseline:initiated today Goal status: Met 01/09/23     LONG TERM GOALS: Target date: 03/20/23  Improve AROm L  shoulder to greater than 140 flex and abd Baseline: 90 Goal status: INITIAL  2.  Improve quick DASH score to 25 L shoulder Baseline: 50 Goal status: INITIAL  3.  Improve strength L shoulder all motions to 5/5  Baseline: 3+/5 IR, 3- flex, abd, 4/5 biceps, 4-/5 ER Goal status: INITIAL     PLAN:  PT FREQUENCY: 1-2x/week  PT DURATION: 12 weeks  PLANNED INTERVENTIONS: Therapeutic exercises, Therapeutic activity, Neuromuscular re-education, Balance training, Gait training, Patient/Family education, Self Care, and Joint mobilization  PLAN FOR NEXT SESSION: continue to advance scapular stability being careful with the RC tear and pain, work on posture   [employer], PT 01/10/2023, 10:58 AM

## 2023-01-14 ENCOUNTER — Ambulatory Visit: Payer: Managed Care, Other (non HMO) | Attending: Orthopaedic Surgery | Admitting: Physical Therapy

## 2023-01-14 DIAGNOSIS — R29898 Other symptoms and signs involving the musculoskeletal system: Secondary | ICD-10-CM | POA: Insufficient documentation

## 2023-01-14 DIAGNOSIS — S46012S Strain of muscle(s) and tendon(s) of the rotator cuff of left shoulder, sequela: Secondary | ICD-10-CM | POA: Insufficient documentation

## 2023-01-14 DIAGNOSIS — S46012D Strain of muscle(s) and tendon(s) of the rotator cuff of left shoulder, subsequent encounter: Secondary | ICD-10-CM | POA: Insufficient documentation

## 2023-01-14 DIAGNOSIS — G8929 Other chronic pain: Secondary | ICD-10-CM | POA: Insufficient documentation

## 2023-01-14 DIAGNOSIS — M25612 Stiffness of left shoulder, not elsewhere classified: Secondary | ICD-10-CM | POA: Insufficient documentation

## 2023-01-14 DIAGNOSIS — M25512 Pain in left shoulder: Secondary | ICD-10-CM | POA: Insufficient documentation

## 2023-01-18 ENCOUNTER — Ambulatory Visit: Payer: Managed Care, Other (non HMO) | Admitting: Physical Therapy

## 2023-01-18 DIAGNOSIS — S46012S Strain of muscle(s) and tendon(s) of the rotator cuff of left shoulder, sequela: Secondary | ICD-10-CM | POA: Diagnosis present

## 2023-01-18 DIAGNOSIS — M25612 Stiffness of left shoulder, not elsewhere classified: Secondary | ICD-10-CM

## 2023-01-18 DIAGNOSIS — M25512 Pain in left shoulder: Secondary | ICD-10-CM | POA: Diagnosis present

## 2023-01-18 DIAGNOSIS — G8929 Other chronic pain: Secondary | ICD-10-CM | POA: Diagnosis present

## 2023-01-18 DIAGNOSIS — S46012D Strain of muscle(s) and tendon(s) of the rotator cuff of left shoulder, subsequent encounter: Secondary | ICD-10-CM | POA: Diagnosis not present

## 2023-01-18 DIAGNOSIS — R29898 Other symptoms and signs involving the musculoskeletal system: Secondary | ICD-10-CM | POA: Diagnosis not present

## 2023-01-18 NOTE — Therapy (Signed)
OUTPATIENT PHYSICAL THERAPY SHOULDER TREATMENT   Patient Name: Craig Bell MRN: [id number] DOB:1966-07-06, 57 y.o., male Today's Date: 01/18/2023  END OF SESSION:  PT End of Session - 01/18/23 0759     Visit Number 4    Date for PT Re-Evaluation 03/20/23    PT Start Time 0800    PT Stop Time 0845    PT Time Calculation (min) 45 min             No past medical history on file. No past surgical history on file. There are no problems to display for this patient.   PCP: Georgann HousekeeperKarrar Husain, MD  REFERRING PROVIDER: Doneen Poissonhristopher Blackman, MD  REFERRING DIAG: chronic pain L shoulder and R hip  THERAPY DIAG:  Strain of musc/tend the rotator cuff of l shoulder, sequela  Chronic left shoulder pain  Weakness of shoulder  Stiffness of left shoulder, not elsewhere classified  Rationale for Evaluation and Treatment: Rehabilitation  ONSET DATE: 11/2021  SUBJECTIVE:   SUBJECTIVE STATEMENT: No issues with hip since inj. Shld better- patch helps  PERTINENT HISTORY: Chronic L shoulder and R lateral hip pain.  Saw orthopedist, had MRI, received injections L shoulder and R hip, referred to PT for eval.  PAIN: 5/10 L shoulder at rest, 0/10 R hip today Are you having pain? Yes: Pain location: L shoulder Pain description: constant pain , ache Aggravating factors: lifting a certain way Relieving factors: meds somewhat  PRECAUTIONS: None  WEIGHT BEARING RESTRICTIONS: No  FALLS:  Has patient fallen in last 6 months? No  LIVING ENVIRONMENT: unknown  OCCUPATION: Emergency planning/management officerpolice officer  PLOF: Independent  PATIENT GOALS: get rid of pain, resume working out as able  NEXT MD VISIT: mid April  OBJECTIVE:   DIAGNOSTIC FINDINGS:Copied from orthopedist's note 12/12/22: His left shoulder does show still signs of impingement.  The MRI shows a small partial-thickness rotator cuff tear but no muscle atrophy.  The remainder of the shoulder looks normal.   Both hips have pain over the  trochanteric area.  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  There is some tendinosis of the gluteus medius and minimus tendons.  Both hips move smoothly and fluidly but do have pain over the lateral aspect.  PATIENT SURVEYS:  Quick DASH 50%  COGNITION: Overall cognitive status: Within functional limits for tasks assessed     SENSATION: WFL  EDEMA:  None noted   POSTURE: rounded shoulders and forward head  PALPATION: L long head biceps, L infraspinatus point tender. L supraspinatus non tender  UPPER EXTREMITY AAROM: supine L shoulder flex 120, abd 110, ER 50, IR 50, all painful throughout motion Seated AROM L shoulder, flex 90, abd 90, able to place L hand behind base of neck with forward bending of head and neck, IR reaches to L gr trochanter   MMT: L bicpes 4/5, IR painful 3+/5, Er 4+/5 not painful   GAIT: no deficits noted.    TODAY'S TREATMENT:  DATE:   01/18/23 UBE L 3 3 min fwd/3  min backward Lat Pull Down 35# 2 sets 12 Seated Row 35# 2 sets 10 L shoulder AROM / MMT standing  Flex 140            4+ pain  Abd 132             4+ pain ER  70               4+    IR  55                 4 pain Green tband shld ext,row,horz abd, and ER 2 sets 10 -issued for [employer], upper cut and empty can 4# 2 sets 10 ( pain with empty can) Passive stretch all shoulder motions Ionto patch 93mA dose left biceps origin.   advised 4 hours only      01/10/23 Nustep level 5 x 6 minutes 25# rows 2x10 cues for form and posture 35# lats 2x10 10# straight arm pulls 5# IR on pulleys Red tband ER Red tband horizontal abduction 35# triceps 2x10 Passive stretch all shoulder motions Ionto patch 37mA dose left biceps origin.  Again advised 4 hours only as he thought he  could leave on for 4 days  01/09/23 UBE L2 x 3 min each Rows & Ext Blue 2x10 LUE IR red 2x10 Triceps Ext 35lb 2x10 L shoulder ROM   Flex 132  Abd 118 ER 82  IR 26 LUE PROM inall directions with end range holds Iontophoresis L shoulder over long head biceps tendon, advised to leave in place for 4 hrs   12/26/22  Iontophoresis L shoulder over long head biceps tendon, advised to leave in place for 4 hrs Kinesiotaping L shoulder 2 I pieces, 35% pull distal to proximal, lateral deltoid along supraspinatus and infraspinatus tendons to assist with resting posture/tension post shoulder musculature Therx:instructed in pendulum and low intensity isometric L shoulder ER  PATIENT EDUCATION:  Education details: POC, goals Person educated: Patient Education method: Explanation, Demonstration, and Handouts Education comprehension: verbalized understanding, tactile cues required, and needs further education  HOME EXERCISE PROGRAM: Access Code: [email or web address] URL: [email or web address] Date: 12/26/2022 Prepared by: Caralee Ates  Exercises - Circular Shoulder Pendulum with Table Support  - 1 x daily - 7 x weekly - 3 sets - 10 reps - Standing Isometric Shoulder External Rotation with Doorway  - 1 x daily - 7 x weekly - 3 sets - 10 reps  ASSESSMENT:  CLINICAL IMPRESSION: some progression with AROM. MMT better but still pain limited. Pt with fwd head and rounded shld ,needs postural cuing with all ex. Pt states not ex at home in fear of hurting more- issued add'l HEP. Goals assessed  PARTICIPATION LIMITATIONS: community activity, occupation, and yard work  PERSONAL FACTORS: Time since onset of injury/illness/exacerbation are also affecting patient's functional outcome.   REHAB POTENTIAL: Good  CLINICAL DECISION MAKING: Stable/uncomplicated  EVALUATION COMPLEXITY: Low   GOALS: Goals reviewed with patient? Yes  SHORT TERM GOALS: Target date: 01/09/23 I HEP Baseline:initiated  today Goal status: Met 01/09/23     LONG TERM GOALS: Target date: 03/20/23  Improve AROm L shoulder to greater than 140 flex and abd Baseline: 90 Goal status: 01/17/23 progressing  2.  Improve quick DASH score to 25 L shoulder Baseline: 50 Goal status: INITIAL  3.  Improve strength L shoulder all motions to 5/5  Baseline: 3+/5 IR, 3- flex, abd, 4/5 biceps, 4-/5  ER Goal status: 01/17/23 progressing     PLAN:  PT FREQUENCY: 1-2x/week  PT DURATION: 12 weeks  PLANNED INTERVENTIONS: Therapeutic exercises, Therapeutic activity, Neuromuscular re-education, Balance training, Gait training, Patient/Family education, Self Care, and Joint mobilization  PLAN FOR NEXT SESSION: continue to advance scapular stability being careful with the RC tear and pain, work on posture   Craig Bell,Craig Bell, Craig Bell 01/18/2023, 8:00 AM [facility] [address] [place], [place], [address] Phone: [phone]   Fax:  [phone]  Patient Details  Name: Craig Bell MRN: [id number] Date of Birth: [birth year] Referring Provider:  Georgann HousekeeperHusain, Karrar, MD  Encounter Date: 01/18/2023   Craig MarkerPAYSEUR,Craig Bell, Craig Bell 01/18/2023, 8:00 AM  [facility] [address] [place], [place], [address] Phone: [phone]   Fax:  [phone]

## 2023-01-23 ENCOUNTER — Ambulatory Visit: Payer: Managed Care, Other (non HMO)

## 2023-01-23 ENCOUNTER — Ambulatory Visit: Payer: Managed Care, Other (non HMO) | Admitting: Physical Therapy

## 2023-01-24 ENCOUNTER — Ambulatory Visit: Payer: Managed Care, Other (non HMO) | Admitting: Physical Therapy

## 2023-01-28 ENCOUNTER — Ambulatory Visit: Payer: BC Managed Care – PPO | Admitting: Orthopaedic Surgery

## 2023-02-20 ENCOUNTER — Ambulatory Visit: Payer: BC Managed Care – PPO | Admitting: Orthopaedic Surgery

## 2023-12-27 ENCOUNTER — Encounter: Payer: Self-pay | Admitting: Physician Assistant

## 2023-12-27 ENCOUNTER — Other Ambulatory Visit (INDEPENDENT_AMBULATORY_CARE_PROVIDER_SITE_OTHER): Payer: Self-pay

## 2023-12-27 ENCOUNTER — Ambulatory Visit (INDEPENDENT_AMBULATORY_CARE_PROVIDER_SITE_OTHER): Admitting: Physician Assistant

## 2023-12-27 DIAGNOSIS — M778 Other enthesopathies, not elsewhere classified: Secondary | ICD-10-CM | POA: Diagnosis not present

## 2023-12-27 NOTE — Progress Notes (Signed)
   Office Visit Note   Patient: Craig Bell           Date of Birth: [birth year]           MRN: [id number] Visit Date: 12/27/2023              Requested by: Georgann Housekeeper, MD [address] [place],  [place] [address] PCP: Georgann Housekeeper, MD   Assessment & Plan: Visit Diagnoses:  1. Extensor carpi ulnaris tendinitis     Plan: Impression is left wrist ECU tendinitis.  We have discussed oral and topical anti-inflammatories, rest, ice as well as Velcro wrist splint.  He may wean out of the splint as symptoms allow.  He will follow-up with [place] as needed.  Call with concerns or questions.  Follow-Up Instructions: Return if symptoms worsen or fail to improve.   Orders:  Orders Placed This Encounter  Procedures   XR Wrist Complete Left   No orders of the defined types were placed in this encounter.     Procedures: No procedures performed   Clinical Data: No additional findings.   Subjective: Chief Complaint  Patient presents with   Left Wrist - Pain    HPI patient is a pleasant 58 year old right-hand-dominant gentleman who comes in today with left wrist pain.  Symptoms began a couple weeks ago after picking up his bike after lying down.  He has had pain to the ulnar wrist since.  Symptoms occur anytime he is supinating, pronating or flexing his wrist.  He has been taking ibuprofen without significant relief.  Review of Systems as detailed in HPI.  All others reviewed and are negative.   Objective: Vital Signs: There were no vitals taken for this visit.  Physical Exam well-developed well-nourished gentleman in no acute distress.  Alert and oriented x 3.  Ortho Exam left wrist exam: Tenderness proximal and distal to the ulnar styloid.  He also has tenderness to the forearm along the ECU tendons.  He has increased pain with wrist flexion, supination and pronation.  He is neurovascularly intact distally.  Specialty Comments:  No specialty comments  available.  Imaging: XR Wrist Complete Left Result Date: 12/27/2023 No acute or structural abnormalities    PMFS History: There are no active problems to display for this patient.  No past medical history on file.  No family history on file.  History reviewed. No pertinent surgical history. Social History   Occupational History   Not on file  Tobacco Use   Smoking status: Not on file   Smokeless tobacco: Not on file  Substance and Sexual Activity   Alcohol use: Not on file   Drug use: Not on file   Sexual activity: Not on file

## 2024-08-17 ENCOUNTER — Encounter: Payer: Self-pay | Admitting: Radiology
